# Patient Record
Sex: Male | Born: 2000 | Hispanic: No | Marital: Single | Smoking: Never smoker
Health system: Southern US, Community
[De-identification: ages and names within clinical notes are randomized; demographics above are authoritative.]

## PROBLEM LIST (undated history)

## (undated) DIAGNOSIS — F909 Attention-deficit hyperactivity disorder, unspecified type: Secondary | ICD-10-CM

## (undated) HISTORY — PX: TONSILLECTOMY: SUR1361

## (undated) HISTORY — PX: LYMPH NODE BIOPSY: SHX201

---
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
## 2023-06-07 ENCOUNTER — Emergency Department (HOSPITAL_BASED_OUTPATIENT_CLINIC_OR_DEPARTMENT_OTHER)
Admission: EM | Admit: 2023-06-07 | Discharge: 2023-06-07 | Disposition: A | Discharge status: Home / Self Care | Attending: Emergency Medicine | Admitting: Emergency Medicine

## 2023-06-07 ENCOUNTER — Other Ambulatory Visit: Payer: Self-pay

## 2023-06-07 ENCOUNTER — Emergency Department (HOSPITAL_BASED_OUTPATIENT_CLINIC_OR_DEPARTMENT_OTHER)

## 2023-06-07 ENCOUNTER — Encounter (HOSPITAL_BASED_OUTPATIENT_CLINIC_OR_DEPARTMENT_OTHER): Payer: Self-pay | Admitting: Emergency Medicine

## 2023-06-07 DIAGNOSIS — R42 Dizziness and giddiness: Secondary | ICD-10-CM | POA: Diagnosis present

## 2023-06-07 DIAGNOSIS — H8112 Benign paroxysmal vertigo, left ear: Secondary | ICD-10-CM | POA: Diagnosis not present

## 2023-06-07 LAB — CBC WITH DIFFERENTIAL/PLATELET
Abs Immature Granulocytes: 0.01 10*3/uL (ref 0.00–0.07)
Basophils Absolute: 0 10*3/uL (ref 0.0–0.1)
Basophils Relative: 1 %
Eosinophils Absolute: 0.1 10*3/uL (ref 0.0–0.5)
Eosinophils Relative: 1 %
HCT: 44.3 % (ref 39.0–52.0)
Hemoglobin: 14.9 g/dL (ref 13.0–17.0)
Immature Granulocytes: 0 %
Lymphocytes Relative: 44 %
Lymphs Abs: 2.5 10*3/uL (ref 0.7–4.0)
MCH: 27.7 pg (ref 26.0–34.0)
MCHC: 33.6 g/dL (ref 30.0–36.0)
MCV: 82.5 fL (ref 80.0–100.0)
Monocytes Absolute: 0.5 10*3/uL (ref 0.1–1.0)
Monocytes Relative: 9 %
Neutro Abs: 2.5 10*3/uL (ref 1.7–7.7)
Neutrophils Relative %: 45 %
Platelets: 222 10*3/uL (ref 150–400)
RBC: 5.37 MIL/uL (ref 4.22–5.81)
RDW: 12.5 % (ref 11.5–15.5)
WBC: 5.7 10*3/uL (ref 4.0–10.5)
nRBC: 0 % (ref 0.0–0.2)

## 2023-06-07 LAB — COMPREHENSIVE METABOLIC PANEL
ALT: 24 U/L (ref 0–44)
AST: 19 U/L (ref 15–41)
Albumin: 4.6 g/dL (ref 3.5–5.0)
Alkaline Phosphatase: 81 U/L (ref 38–126)
Anion gap: 9 (ref 5–15)
BUN: 12 mg/dL (ref 6–20)
CO2: 24 mmol/L (ref 22–32)
Calcium: 9.1 mg/dL (ref 8.9–10.3)
Chloride: 105 mmol/L (ref 98–111)
Creatinine, Ser: 0.83 mg/dL (ref 0.61–1.24)
GFR, Estimated: >60 mL/min (ref >60)
Glucose, Bld: 97 mg/dL (ref 70–99)
Potassium: 3.6 mmol/L (ref 3.5–5.1)
Sodium: 138 mmol/L (ref 135–145)
Total Bilirubin: 0.5 mg/dL (ref 0.3–1.2)
Total Protein: 7.6 g/dL (ref 6.5–8.1)

## 2023-06-07 LAB — CBG MONITORING, ED: Glucose-Capillary: 87 mg/dL (ref 70–99)

## 2023-06-07 MED ORDER — IOHEXOL 350 MG/ML SOLN
100.0000 mL | Freq: Once | INTRAVENOUS | Status: AC | PRN
  Administered 2023-06-07: 75 mL via INTRAVENOUS

## 2023-06-07 NOTE — Discharge Instructions (Addendum)
You are seen in the emergency department for dizziness.  Thankfully your labs and imaging are largely unremarkable without any acute normalities noted to account for your symptoms.  I have attached a handout on how to perform the Epley maneuver at home to help address some of your symptoms and hopefully provide some relief.  Continue to take the meclizine that you have found to be effective for radiologist will continue to manage your vertigo.  If you continue to have persistent symptoms or lack of improvement, it would be advisable to follow-up with an ENT for further evaluation.  Please return to the emergency department if you have any acute worsening of your symptoms.

## 2023-06-07 NOTE — ED Notes (Signed)
Patient transported to CT 

## 2023-06-07 NOTE — ED Triage Notes (Signed)
Dizziness x 2 weeks , bilateral eye pressure , nausea , emesis. Reports feels room spinning . No Hx vertigo , no ear issues .  Alert and oriented x 4 , ambulatory with steady gait .  No pain or further symptoms . Reports had panic attack last time he had dizziness.

## 2023-06-07 NOTE — ED Provider Notes (Signed)
EMERGENCY DEPARTMENT AT [facility] Provider Note   CSN: [id number] Arrival date & time: 06/07/23  1036     History Chief Complaint  Patient presents with   Dizziness    Kevin Mejia is a 22 y.o. male.  Patient past history significant for ADHD presents emergency department complaints of dizziness.  Reports the dizziness been off and on for the last 2 weeks endorses bilateral eye pressure, nausea, vomiting.  Has a sensation of the room is spinning.  No prior obvious history of vertigo [place] his symptoms have been starting and prescribed meclizine.  Patient denies any fevers, ear pain, sore throat, cough, congestion.  Endorses a history of anxiety and panic attacks but not currently taking anything for these.  Feels that when he is typically mentally occupied his symptoms or not noticeably present but will typically worsen when he feels that his anxiety has been worsening.   Dizziness Associated symptoms: no headaches        Home Medications Prior to Admission medications   Not on File      Allergies    Patient has no known allergies.    Review of Systems   Review of Systems  Neurological:  Positive for dizziness. Negative for numbness and headaches.  All other systems reviewed and are negative.   Physical Exam Updated Vital Signs BP (!) 120/59   Pulse (!) 49   Temp 98.2 F (36.8 C) (Oral)   Resp 17   Wt 82.6 kg   SpO2 100%  Physical Exam Vitals and nursing note reviewed.  Constitutional:      General: He is not in acute distress.    Appearance: He is well-developed.  HENT:     Head: Normocephalic and atraumatic.  Eyes:     Conjunctiva/sclera: Conjunctivae normal.  Cardiovascular:     Rate and Rhythm: Normal rate and regular rhythm.     Heart sounds: No murmur heard. Pulmonary:     Effort: Pulmonary effort is normal. No respiratory distress.     Breath sounds: Normal breath sounds.   Abdominal:     Palpations: Abdomen is soft.     Tenderness: There is no abdominal tenderness.  Musculoskeletal:        General: No swelling.     Cervical back: Neck supple.  Skin:    General: Skin is warm and dry.     Capillary Refill: Capillary refill takes less than 2 seconds.  Neurological:     General: No focal deficit present.     Mental Status: He is alert and oriented to person, place, and time.     Cranial Nerves: No cranial nerve deficit.     Motor: No weakness.     Comments: Provocation of vertigo with Epley Maneuver, no resolution. Corrective saccade with horizontal plane movement of head/neck and no observable nystagmus with left or right gaze. Likely HINTS peripheral.  Psychiatric:        Mood and Affect: Mood normal.     ED Results / Procedures / Treatments   Labs (all labs ordered are listed, but only abnormal results are displayed) Labs Reviewed  CBC WITH DIFFERENTIAL/PLATELET  COMPREHENSIVE METABOLIC PANEL  CBG MONITORING, ED    EKG EKG Interpretation Date/Time:  Monday June 07 2023 13:20:03 EDT Ventricular Rate:  55 PR Interval:  162 QRS Duration:  115 QT Interval:  425 QTC Calculation: 407 R Axis:   64  Text Interpretation: Sinus  rhythm Incomplete right bundle branch block No old tracing to compare Confirmed by Rolan Bucco [phone]) on 06/07/2023 1:25:28 PM  Radiology CT ANGIO HEAD NECK W WO CM  Result Date: 06/07/2023 CLINICAL DATA:  Vertigo, central EXAM: CT ANGIOGRAPHY HEAD AND NECK WITH AND WITHOUT CONTRAST TECHNIQUE: Multidetector CT imaging of the head and neck was performed using the standard protocol during bolus administration of intravenous contrast. Multiplanar CT image reconstructions and MIPs were obtained to evaluate the vascular anatomy. Carotid stenosis measurements (when applicable) are obtained utilizing NASCET criteria, using the distal internal carotid diameter as the denominator. RADIATION DOSE REDUCTION: This exam was performed  according to the departmental dose-optimization program which includes automated exposure control, adjustment of the mA and/or kV according to patient size and/or use of iterative reconstruction technique. CONTRAST:  75mL OMNIPAQUE IOHEXOL 350 MG/ML SOLN COMPARISON:  None Available. FINDINGS: CT HEAD FINDINGS Brain: No hemorrhage. No extra-axial fluid collection. No hydrocephalus. No CT evidence of an acute cortical infarct. Apparent focal hypodensity in the right insula is favored to represent volume averaging. Vascular: See below Skull: Normal. Negative for fracture or focal lesion. Sinuses/Orbits: No acute finding. Other: None. Review of the MIP images confirms the above findings CTA NECK FINDINGS Aortic arch: Standard branching. Imaged portion shows no evidence of aneurysm or dissection. No significant stenosis of the major arch vessel origins. Right carotid system: No evidence of dissection, stenosis (50% or greater), or occlusion. Left carotid system: No evidence of dissection, stenosis (50% or greater), or occlusion. Vertebral arteries: Codominant. No evidence of dissection, stenosis (50% or greater), or occlusion. Skeleton: Negative. Other neck: Negative. Upper chest: Negative. Review of the MIP images confirms the above findings CTA HEAD FINDINGS Anterior circulation: No significant stenosis, proximal occlusion, aneurysm, or vascular malformation. Posterior circulation: No significant stenosis, proximal occlusion, aneurysm, or vascular malformation. Venous sinuses: As permitted by contrast timing, patent. Anatomic variants: None Review of the MIP images confirms the above findings IMPRESSION: 1. No acute intracranial process. 2. No intracranial large vessel occlusion or significant stenosis. 3. No hemodynamically significant stenosis in the neck. Electronically Signed   By: Lorenza Cambridge M.D.   On: 06/07/2023 15:02    Procedures Procedures   Medications Ordered in ED Medications  iohexol (OMNIPAQUE)  350 MG/ML injection 100 mL (75 mLs Intravenous Contrast Given 06/07/23 1407)    ED Course/ Medical Decision Making/ A&P                               Medical Decision Making Amount and/or Complexity of Data Reviewed Labs: ordered. Radiology: ordered.  Risk Prescription drug management.   This patient presents to the ED for concern of dizziness.  Differential diagnosis includes vertigo, BPPV, vestibular neuritis, lab otitis, Mnire's disease, cerebellar stroke   Lab Tests:  I Ordered, and personally interpreted labs.  The pertinent results include: CBC unremarkable, CMP unremarkable, CBC normal   Imaging Studies ordered:  I ordered imaging studies including CT angio head and neck I independently visualized and interpreted imaging which showed no acute abnormality noted I agree with the radiologist interpretation   Problem List / ED Course:  Patient presented to the emergency department complaints of dizziness.  States that been ongoing for the last 2 weeks or so with intermittent episodes present typically lasting less than 5 minutes.  Feels that the room spins and these episodes occur but no prior history of vertigo.  Denies any recent ear pain or  upper respiratory tract infection.  Was seen at emergency department a few days ago for similar concerns and given meclizine for management of symptoms.  Does report history of panic attacks but not currently managing symptoms with any anxiolytic medications.  Will evaluate with basic labs, EKG, CT angio head and neck for further evaluation of symptoms. Labs are unremarkable and EKG does not show any acute changes and cardiac function. CT angio negative for any acute abnormalities to account patient symptoms.  Again reassuring that patient symptoms are likely peripheral in nature with localization to the left ear.  Advised patient continue taking the meclizine that he has been prescribed as well as practicing the Epley maneuver at home for  further management of symptoms.  Advised patient that if he has poor improvement of symptoms or persistence, he would benefit from outpatient ENT evaluation.  Patient is agreeable to treatment plan verbalized understanding all return precautions.  All questions answered prior to patient discharge.  Final Clinical Impression(s) / ED Diagnoses Final diagnoses:  Benign paroxysmal positional vertigo of left ear    Rx / DC Orders ED Discharge Orders     None         Smitty Knudsen, PA-C 06/07/23 1543    Rolan Bucco, MD 06/08/23 [phone]
